# Patient Record
Sex: Female | Born: 1965 | Race: Black or African American | Hispanic: No | Marital: Single | Smoking: Never smoker
Health system: Southern US, Community
[De-identification: ages and names within clinical notes are randomized; demographics above are authoritative.]

## PROBLEM LIST (undated history)

## (undated) DIAGNOSIS — E119 Type 2 diabetes mellitus without complications: Secondary | ICD-10-CM

## (undated) HISTORY — PX: CHOLECYSTECTOMY: SHX55

## (undated) HISTORY — PX: LIPOMA EXCISION: SHX5283

---

## 2014-05-23 ENCOUNTER — Emergency Department (HOSPITAL_BASED_OUTPATIENT_CLINIC_OR_DEPARTMENT_OTHER)
Admission: EM | Admit: 2014-05-23 | Discharge: 2014-05-23 | Disposition: A | Discharge status: Home / Self Care | Payer: BLUE CROSS/BLUE SHIELD | Attending: Emergency Medicine | Admitting: Emergency Medicine

## 2014-05-23 ENCOUNTER — Encounter (HOSPITAL_BASED_OUTPATIENT_CLINIC_OR_DEPARTMENT_OTHER): Payer: Self-pay | Admitting: Emergency Medicine

## 2014-05-23 DIAGNOSIS — R42 Dizziness and giddiness: Secondary | ICD-10-CM

## 2014-05-23 DIAGNOSIS — R739 Hyperglycemia, unspecified: Secondary | ICD-10-CM

## 2014-05-23 DIAGNOSIS — Z79899 Other long term (current) drug therapy: Secondary | ICD-10-CM | POA: Diagnosis not present

## 2014-05-23 DIAGNOSIS — I951 Orthostatic hypotension: Secondary | ICD-10-CM | POA: Diagnosis not present

## 2014-05-23 DIAGNOSIS — E1165 Type 2 diabetes mellitus with hyperglycemia: Secondary | ICD-10-CM | POA: Diagnosis not present

## 2014-05-23 HISTORY — DX: Type 2 diabetes mellitus without complications: E11.9

## 2014-05-23 LAB — CBC WITH DIFFERENTIAL/PLATELET
BASOS ABS: 0 10*3/uL (ref 0.0–0.1)
Basophils Relative: 0 % (ref 0–1)
EOS PCT: 2 % (ref 0–5)
Eosinophils Absolute: 0.1 10*3/uL (ref 0.0–0.7)
HEMATOCRIT: 33.4 % — AB (ref 36.0–46.0)
HEMOGLOBIN: 11 g/dL — AB (ref 12.0–15.0)
LYMPHS ABS: 1.5 10*3/uL (ref 0.7–4.0)
LYMPHS PCT: 34 % (ref 12–46)
MCH: 24.6 pg — ABNORMAL LOW (ref 26.0–34.0)
MCHC: 32.9 g/dL (ref 30.0–36.0)
MCV: 74.6 fL — ABNORMAL LOW (ref 78.0–100.0)
Monocytes Absolute: 0.3 10*3/uL (ref 0.1–1.0)
Monocytes Relative: 6 % (ref 3–12)
NEUTROS ABS: 2.6 10*3/uL (ref 1.7–7.7)
Neutrophils Relative %: 58 % (ref 43–77)
Platelets: 229 10*3/uL (ref 150–400)
RBC: 4.48 MIL/uL (ref 3.87–5.11)
RDW: 13.2 % (ref 11.5–15.5)
WBC: 4.5 10*3/uL (ref 4.0–10.5)

## 2014-05-23 LAB — URINALYSIS, ROUTINE W REFLEX MICROSCOPIC
BILIRUBIN URINE: NEGATIVE
Ketones, ur: NEGATIVE mg/dL
Leukocytes, UA: NEGATIVE
Nitrite: NEGATIVE
Protein, ur: NEGATIVE mg/dL
Specific Gravity, Urine: 1.023 (ref 1.005–1.030)
UROBILINOGEN UA: 0.2 mg/dL (ref 0.0–1.0)
pH: 5.5 (ref 5.0–8.0)

## 2014-05-23 LAB — I-STAT CHEM 8, ED
BUN: 13 mg/dL (ref 6–23)
CALCIUM ION: 1.15 mmol/L (ref 1.12–1.23)
Chloride: 98 mEq/L (ref 96–112)
Creatinine, Ser: 0.7 mg/dL (ref 0.50–1.10)
GLUCOSE: 399 mg/dL — AB (ref 70–99)
POTASSIUM: 3.6 mmol/L (ref 3.5–5.1)
SODIUM: 136 mmol/L (ref 135–145)
TCO2: 20 mmol/L (ref 0–100)

## 2014-05-23 LAB — CBG MONITORING, ED
Glucose-Capillary: 451 mg/dL — ABNORMAL HIGH (ref 70–99)
Glucose-Capillary: 288 mg/dL — ABNORMAL HIGH (ref 70–99)

## 2014-05-23 LAB — URINE MICROSCOPIC-ADD ON

## 2014-05-23 MED ORDER — SODIUM CHLORIDE 0.9 % IV BOLUS (SEPSIS)
1000.0000 mL | Freq: Once | INTRAVENOUS | Status: AC
  Administered 2014-05-23: 1000 mL via INTRAVENOUS

## 2014-05-23 MED ORDER — METFORMIN HCL ER (MOD) 500 MG PO TB24: 500.0000 mg | ORAL_TABLET | Freq: Two times a day (BID) | ORAL | Status: AC

## 2014-05-23 NOTE — ED Provider Notes (Signed)
CSN: [id number]     Arrival date & time 05/23/14  1207 History   First MD Initiated Contact with Patient 05/23/14 1229     Chief Complaint  Patient presents with  . Dizziness     (Consider location/radiation/quality/duration/timing/severity/associated sxs/prior Treatment) HPI  Deanna Cline is a 49 y.o. female with PMH of type 2 diabetes presenting with 30 minute episode of lightheadedness this a.m. which resolved after sitting down. She denies any headache, slurred speech, visual changes, weakness. Patient has never had dizziness or lightheadedness before. Patient denies numbness, tingling. Patient denies any nausea, vomiting, abdominal pain. She does need her electronic normal. Patient concerned her blood sugar was low and a snack. She felt better. Patient states she is out of her metformin did not take it today. She did take last night. She does not monitor her blood sugar. No back pain or URI symptoms. Patient denies urinary symptoms.   Past Medical History  Diagnosis Date  . Diabetes mellitus without complication    Past Surgical History  Procedure Laterality Date  . Cesarean section    . Cholecystectomy    . Lipoma excision     No family history on file. History  Substance Use Topics  . Smoking status: Never Smoker   . Smokeless tobacco: Not on file  . Alcohol Use: Not on file   OB History    No data available     Review of Systems 10 Systems reviewed and are negative for acute change except as noted in the HPI.    Allergies  Zantac  Home Medications   Prior to Admission medications   Medication Sig Start Date End Date Taking? Authorizing Provider  metFORMIN (GLUMETZA) 500 MG (MOD) 24 hr tablet Take 500 mg by mouth 2 (two) times daily with a meal.   Yes Historical Provider, MD  metFORMIN (GLUMETZA) 500 MG (MOD) 24 hr tablet Take 1 tablet (500 mg total) by mouth 2 (two) times daily with a meal. 05/23/14   Benetta SparVictoria L Carry Weesner, PA-C   BP 148/90 mmHg  Pulse 90   Temp(Src) 98.4 F (36.9 C)  Resp 16  Ht 5\' 4"  (1.626 m)  Wt 148 lb (67.132 kg)  BMI 25.39 kg/m2  SpO2 98%  LMP 05/15/2014 Physical Exam  Constitutional: She appears well-developed and well-nourished. No distress.  HENT:  Head: Normocephalic and atraumatic.  Mouth/Throat: Oropharynx is clear and moist.  Eyes: Conjunctivae and EOM are normal. Pupils are equal, round, and reactive to light. Right eye exhibits no discharge. Left eye exhibits no discharge.  Neck: Normal range of motion. Neck supple.  No nuchal rigidity  Cardiovascular: Normal rate and regular rhythm.   Pulmonary/Chest: Effort normal and breath sounds normal. No respiratory distress. She has no wheezes.  Abdominal: Soft. Bowel sounds are normal. She exhibits no distension. There is no tenderness.  Neurological: She is alert. No cranial nerve deficit. Coordination normal.  Speech is clear and goal oriented. Right nystagmus. Strength 5/5 in upper and lower extremities. Sensation intact. Intact rapid alternating movements, finger to nose, and heel to shin. Negative Romberg. No pronator drift. Normal gait.   Skin: Skin is warm and dry. She is not diaphoretic.  Nursing note and vitals reviewed.   ED Course  Procedures (including critical care time) Labs Review Labs Reviewed  CBC WITH DIFFERENTIAL - Abnormal; Notable for the following:    Hemoglobin 11.0 (*)    HCT 33.4 (*)    MCV 74.6 (*)    MCH 24.6 (*)  All other components within normal limits  URINALYSIS, ROUTINE W REFLEX MICROSCOPIC - Abnormal; Notable for the following:    APPearance CLOUDY (*)    Glucose, UA >1000 (*)    Hgb urine dipstick MODERATE (*)    All other components within normal limits  URINE MICROSCOPIC-ADD ON - Abnormal; Notable for the following:    Squamous Epithelial / LPF FEW (*)    Bacteria, UA MANY (*)    All other components within normal limits  CBG MONITORING, ED - Abnormal; Notable for the following:    Glucose-Capillary 451 (*)     All other components within normal limits  I-STAT CHEM 8, ED - Abnormal; Notable for the following:    Glucose, Bld 399 (*)    All other components within normal limits  CBG MONITORING, ED - Abnormal; Notable for the following:    Glucose-Capillary 288 (*)    All other components within normal limits    Imaging Review No results found.   EKG Interpretation   Date/Time:  Tuesday May 23 2014 13:11:36 EST Ventricular Rate:  93 PR Interval:  152 QRS Duration: 70 QT Interval:  370 QTC Calculation: 460 R Axis:   -5 Text Interpretation:  Normal sinus rhythm Normal ECG No previous tracing  Confirmed by Anitra Lauth  MD, Alphonzo Lemmings ([id number]) on 05/23/2014 2:28:09 PM      MDM   Final diagnoses:  Lightheaded  Hyperglycemia  Orthostasis   Pt with history of diabetes treated with metformin presenting with episode of lightheadedness that resolved with sitting. Pt asymptomatic in ED. VSS. Patient orthostatic. No focal neurological findings. CBG 450. EKG without acute abnormalities. UA without ketones but evidence of asymptomatic bacteriuria. Patient without electrolyte abnormalities. A shunt with mild anemia. Lightheadedness likely due to orthostasis as well as hyperglycemia. Patient given fluids in ED with improvement of her hyperglycemia. Patient given strict prescription for metformin. Patient without a PCP. Patient to establish care and follow up. Discussed with patient the importance of following up with primary care ED resources provided. Pt appears reliable for follow up and is agreeable to discharge.   Discussed return precautions with patient. Discussed all results and patient verbalizes understanding and agrees with plan.  Case has been discussed with Dr. Anitra Lauth who agrees with the above plan and to discharge.     Louann Sjogren, PA-C 05/23/14 1737  Gwyneth Sprout, MD 05/26/14 2340

## 2014-05-23 NOTE — ED Notes (Signed)
Pt state she was lightheaded this am and believed her blood sugar was low.  CBG here done.  Pt states she is out of her metformin.

## 2014-05-23 NOTE — Discharge Instructions (Signed)
Return to the emergency room with worsening of symptoms, new symptoms or with symptoms that are concerning , especially severe worsening of headache, visual or speech changes, weakness in face, arms or legs. Read below information and follow all recommendations. Drink plenty of fluids and take your metformin.  Please call your doctor for a followup appointment within 24-48 hours. When you talk to your doctor please let them know that you were seen in the emergency department and have them acquire all of your records so that they can discuss the findings with you and formulate a treatment plan to fully care for your new and ongoing problems. If you do not have a primary care provider please call the number below under ED resources to establish care with a provider and follow up.    Dizziness Dizziness is a common problem. It is a feeling of unsteadiness or light-headedness. You may feel like you are about to faint. Dizziness can lead to injury if you stumble or fall. A person of any age group can suffer from dizziness, but dizziness is more common in older adults. CAUSES  Dizziness can be caused by many different things, including:  Middle ear problems.  Standing for too long.  Infections.  An allergic reaction.  Aging.  An emotional response to something, such as the sight of blood.  Side effects of medicines.  Tiredness.  Problems with circulation or blood pressure.  Excessive use of alcohol or medicines, or illegal drug use.  Breathing too fast (hyperventilation).  An irregular heart rhythm (arrhythmia).  A low red blood cell count (anemia).  Pregnancy.  Vomiting, diarrhea, fever, or other illnesses that cause body fluid loss (dehydration).  Diseases or conditions such as Parkinson's disease, high blood pressure (hypertension), diabetes, and thyroid problems.  Exposure to extreme heat. DIAGNOSIS  Your health care provider will ask about your symptoms, perform a  physical exam, and perform an electrocardiogram (ECG) to record the electrical activity of your heart. Your health care provider may also perform other heart or blood tests to determine the cause of your dizziness. These may include:  Transthoracic echocardiogram (TTE). During echocardiography, sound waves are used to evaluate how blood flows through your heart.  Transesophageal echocardiogram (TEE).  Cardiac monitoring. This allows your health care provider to monitor your heart rate and rhythm in real time.  Holter monitor. This is a portable device that records your heartbeat and can help diagnose heart arrhythmias. It allows your health care provider to track your heart activity for several days if needed.  Stress tests by exercise or by giving medicine that makes the heart beat faster. TREATMENT  Treatment of dizziness depends on the cause of your symptoms and can vary greatly. HOME CARE INSTRUCTIONS   Drink enough fluids to keep your urine clear or pale yellow. This is especially important in very hot weather. In older adults, it is also important in cold weather.  Take your medicine exactly as directed if your dizziness is caused by medicines. When taking blood pressure medicines, it is especially important to get up slowly.  Rise slowly from chairs and steady yourself until you feel okay.  In the morning, first sit up on the side of the bed. When you feel okay, stand slowly while holding onto something until you know your balance is fine.  Move your legs often if you need to stand in one place for a long time. Tighten and relax your muscles in your legs while standing.  Have someone stay  with you for 1-2 days if dizziness continues to be a problem. Do this until you feel you are well enough to stay alone. Have the person call your health care provider if he or she notices changes in you that are concerning.  Do not drive or use heavy machinery if you feel dizzy.  Do not drink  alcohol. SEEK IMMEDIATE MEDICAL CARE IF:   Your dizziness or light-headedness gets worse.  You feel nauseous or vomit.  You have problems talking, walking, or using your arms, hands, or legs.  You feel weak.  You are not thinking clearly or you have trouble forming sentences. It may take a friend or family member to notice this.  You have chest pain, abdominal pain, shortness of breath, or sweating.  Your vision changes.  You notice any bleeding.  You have side effects from medicine that seems to be getting worse rather than better. MAKE SURE YOU:   Understand these instructions.  Will watch your condition.  Will get help right away if you are not doing well or get worse. Document Released: 10/22/2000 Document Revised: 05/03/2013 Document Reviewed: 11/15/2010 [facility] Patient Information 2015 [place], [place]. This information is not intended to replace advice given to you by your health care provider. Make sure you discuss any questions you have with your health care provider. Hyperglycemia Hyperglycemia occurs when the glucose (sugar) in your blood is too high. Hyperglycemia can happen for many reasons, but it most often happens to people who do not know they have diabetes or are not managing their diabetes properly.  CAUSES  Whether you have diabetes or not, there are other causes of hyperglycemia. Hyperglycemia can occur when you have diabetes, but it can also occur in other situations that you might not be as aware of, such as: Diabetes  If you have diabetes and are having problems controlling your blood glucose, hyperglycemia could occur because of some of the following reasons:  Not following your meal plan.  Not taking your diabetes medications or not taking it properly.  Exercising less or doing less activity than you normally do.  Being sick. Pre-diabetes  This cannot be ignored. Before people develop Type 2 diabetes, they almost always have "pre-diabetes." This  is when your blood glucose levels are higher than normal, but not yet high enough to be diagnosed as diabetes. Research has shown that some long-term damage to the body, especially the heart and circulatory system, may already be occurring during pre-diabetes. If you take action to manage your blood glucose when you have pre-diabetes, you may delay or prevent Type 2 diabetes from developing. Stress  If you have diabetes, you may be "diet" controlled or on oral medications or insulin to control your diabetes. However, you may find that your blood glucose is higher than usual in the hospital whether you have diabetes or not. This is often referred to as "stress hyperglycemia." Stress can elevate your blood glucose. This happens because of hormones put out by the body during times of stress. If stress has been the cause of your high blood glucose, it can be followed regularly by your caregiver. That way he/she can make sure your hyperglycemia does not continue to get worse or progress to diabetes. Steroids  Steroids are medications that act on the infection fighting system (immune system) to block inflammation or infection. One side effect can be a rise in blood glucose. Most people can produce enough extra insulin to allow for this rise, but for those who  cannot, steroids make blood glucose levels go even higher. It is not unusual for steroid treatments to "uncover" diabetes that is developing. It is not always possible to determine if the hyperglycemia will go away after the steroids are stopped. A special blood test called an A1c is sometimes done to determine if your blood glucose was elevated before the steroids were started. SYMPTOMS  Thirsty.  Frequent urination.  Dry mouth.  Blurred vision.  Tired or fatigue.  Weakness.  Sleepy.  Tingling in feet or leg. DIAGNOSIS  Diagnosis is made by monitoring blood glucose in one or all of the following ways:  A1c test. This is a chemical found in  your blood.  Fingerstick blood glucose monitoring.  Laboratory results. TREATMENT  First, knowing the cause of the hyperglycemia is important before the hyperglycemia can be treated. Treatment may include, but is not be limited to:  Education.  Change or adjustment in medications.  Change or adjustment in meal plan.  Treatment for an illness, infection, etc.  More frequent blood glucose monitoring.  Change in exercise plan.  Decreasing or stopping steroids.  Lifestyle changes. HOME CARE INSTRUCTIONS   Test your blood glucose as directed.  Exercise regularly. Your caregiver will give you instructions about exercise. Pre-diabetes or diabetes which comes on with stress is helped by exercising.  Eat wholesome, balanced meals. Eat often and at regular, fixed times. Your caregiver or nutritionist will give you a meal plan to guide your sugar intake.  Being at an ideal weight is important. If needed, losing as little as 10 to 15 pounds may help improve blood glucose levels. SEEK MEDICAL CARE IF:   You have questions about medicine, activity, or diet.  You continue to have symptoms (problems such as increased thirst, urination, or weight gain). SEEK IMMEDIATE MEDICAL CARE IF:   You are vomiting or have diarrhea.  Your breath smells fruity.  You are breathing faster or slower.  You are very sleepy or incoherent.  You have numbness, tingling, or pain in your feet or hands.  You have chest pain.  Your symptoms get worse even though you have been following your caregiver's orders.  If you have any other questions or concerns. Document Released: 10/22/2000 Document Revised: 07/21/2011 Document Reviewed: 08/25/2011 [facility]ExitCare Patient Information 2015 [place], [place]. This information is not intended to replace advice given to you by your health care provider. Make sure you discuss any questions you have with your health care provider.   Emergency Department Resource  Guide 1) Find a Doctor and Pay Out of Pocket Although you won't have to find out who is covered by your insurance plan, it is a good idea to ask around and get recommendations. You will then need to call the office and see if the doctor you have chosen will accept you as a new patient and what types of options they offer for patients who are self-pay. Some doctors offer discounts or will set up payment plans for their patients who do not have insurance, but you will need to ask so you aren't surprised when you get to your appointment.  2) Contact Your Local Health Department Not all health departments have doctors that can see patients for sick visits, but many do, so it is worth a call to see if yours does. If you don't know where your local health department is, you can check in your phone book. The CDC also has a tool to help you locate your state's health department, and many  state websites also have listings of all of their local health departments.  3) Find a Walk-in Clinic If your illness is not likely to be very severe or complicated, you may want to try a walk in clinic. These are popping up all over the country in pharmacies, drugstores, and shopping centers. They're usually staffed by nurse practitioners or physician assistants that have been trained to treat common illnesses and complaints. They're usually fairly quick and inexpensive. However, if you have serious medical issues or chronic medical problems, these are probably not your best option.  No Primary Care Doctor: - Call Health Connect at  [phone] - they can help you locate a primary care doctor that  accepts your insurance, provides certain services, etc. - Physician Referral Service- [phone]  Chronic Pain Problems: Organization         Address  Phone   Notes  [facility]  [phone] Patients need to be referred by their primary care doctor.   Medication Assistance: Organization          Address  Phone   Notes  [facility] Medication [facility] [address] [place] [place], [place] [address] [phone] --Must be a resident of [facility] -- Must have NO insurance coverage whatsoever (no Medicaid/ Medicare, etc.) -- The pt. MUST have a primary care doctor that directs their care regularly and follows them in the community   MedAssist  [phone]   [employer]  [phone]    Agencies that provide inexpensive medical care: Organization         Address  Phone   Notes  [facility]  [phone]   [facility]    [phone]   [facility] [address] [place], [place] [address] [phone]   [facility] [address] [place] [place] [address] [place] [phone]   Planned Parenthood    [phone]   [facility]    [phone]   Community Health and [facility]  [address], [place] Phone:  [phone], Fax:  [phone] Hours of Operation:  9 am - 6 pm, M-F.  Also accepts Medicaid/Medicare and self-pay.  [facility]  [address], [place] Phone: [phone], Fax: [phone]. Hours of Operation:  8:30 am - 5:30 pm, M-F.  Also accepts Medicaid and self-pay.  [facility] [place] [address] [place] Phone: [phone]   [facility] [address] Natasha Bence [place], [place] [phone], Ext. [phone] Mondays & Thursdays: 7-9 AM.  First 15 patients are seen on a first come, first serve basis.    Medicaid-accepting [facility] Providers:  Organization         Address  Phone   Notes  [facility] [address], [place] [phone] Also accepts self-pay patients.  [facility] [address] [place], [place]  [phone]   [facility] [address], [place] [phone]   [facility] [address] [place] [phone]   Renaye Rakers [address], [place]   [phone] Only accepts [place] Access [place] patients after they have their name applied to their card.   Self-Pay (no insurance) in [facility]:  [employer]  Phone   Notes  Sickle Cell Patients, [facility] [address] [place], [place] [phone]   [facility] [address] [place], [place] [phone]   [facility] [place]  [address], [place] [phone]   [facility]/Dr. Osei-Bonsu  [address] [place] or [address], [place] [phone] Phone number for both [place] and [place] locations is the same.  Urgent Medical and [facility] [address] [place] [phone]   [facility] [address] [place] or [address] [phone]   [facility] [address] [place] [phone], phone; [phone], fax Sees patients 1st and 3rd Saturday of every month.  Must not qualify for public or private insurance (i.e. Medicaid, Medicare, [place] Health Choice, Veterans' Benefits)  Household income should be no more than 200% of the poverty level The clinic cannot treat you if you are pregnant or think you are pregnant  Sexually transmitted diseases are not treated at the clinic.    Dental Care: Organization         Address  Phone  Notes  [facility] Department of [facility] [address] [place], [place] [phone] Accepts children up to age 18 who are enrolled in [place] or [place] Health Choice; pregnant women with a Medicaid card; and children who have applied for Medicaid or [place] Health Choice, but were declined, whose parents can pay a reduced fee at time of service.  [facility]  [address], [place] [phone] Accepts children up to age 42 who are enrolled in [place] or [place] Health Choice; pregnant women with a Medicaid card; and children who have applied for Medicaid or [place] Health Choice, but were declined, whose parents can pay a reduced fee at time of service.  [facility]  [address] [place], [place] [phone] Patients are seen by appointment only. Walk-ins are not accepted. [facility] will see patients 28 years of age and older. Monday - Tuesday (8am-5pm) Most Wednesdays (8:30-5pm) $30 per visit, cash only  [facility] Adult [facility] Access [facility]  [address], [facility] [phone] Patients are seen by appointment only. Walk-ins are not accepted. [facility] will see patients 58 years of age and older. One Wednesday Evening (Monthly: Volunteer Based).  $30 per visit, cash only  [employer]  [phone] for adults; Children under age 46, call Graduate Pediatric Dentistry at [phone]. Children aged 17-14, please call [phone] to request a pediatric application.  Dental services are provided in all areas of dental care including fillings, crowns and bridges, complete and partial dentures, implants, gum treatment, root canals, and extractions. Preventive care is also provided. Treatment is provided to both adults and children. Patients are selected via a lottery and there is often a waiting list.   [facility] [address], [place]  [phone] [email or web address]   [facility] [address] [place], [place] [phone], Ext. [phone] Second and Fourth Thursday of each month, opens at 6:30 AM; Clinic ends at 9 AM.  Patients are seen on a first-come first-served basis, and a limited number are seen during each clinic.   [facility]  [address] [place], [place] [phone])  [phone]   Eligibility Requirements You must  have lived in [place], [place], or [place] counties for at least the last three months.   You cannot be eligible for state or federal sponsored [place], including [employer], [place], or [employer].   You generally cannot be eligible for healthcare insurance through your employer.    How to apply: Eligibility screenings are held every Tuesday and Wednesday afternoon from 1:00 pm until 4:00 pm. You do not need an appointment for the interview!  [facility] [address] [place] [address], [place], [place] [phone]   [facility] Health Department  [phone]   [facility] Department  [phone]   [facility]  [phone]    Behavioral Health Resources in the Community: Intensive Outpatient Programs Organization         Address  Phone  Notes  [facility] [address], [place], [place] [phone]   [facility] [address] [place], [place] [phone]   [facility] [address] [place], [place]  [phone]   [facility] [address],  [place], [place] [phone] or [phone]   Substance Abuse Resources Organization         Address  Phone  Notes  [facility]  [phone]   [facility]  [phone]   [facility]  [phone]   Floydene Flock  [phone]   Residential & Outpatient Substance Abuse Program  [phone]   Psychological Services Organization         Address  Phone  Notes  [facility]  [phone]   [facility]  [phone]   [facility] [address] [place] [phone] or [phone]    Mobile Crisis Teams Organization         Address  Phone  Notes  [facility], Mobile Crisis Care Unit  [phone]   [facility]  [address] [place], [place] [phone]   Doristine Locks [address] [place] [phone]    Self-Help/Support Groups Organization         Address  Phone             Notes  [facility] - variety of support groups  336- I7437963 Call for more information  Narcotics Anonymous (NA), [facility] [address], [employer] [place]  2 meetings at this location   Statistician         Address  Phone  Notes  [facility] [address],    [place]  [phone]   [facility]  [address] [place] [address], [place], [place] [phone]   [facility] [address] [place], [place] [phone] Admissions: 8am-3pm M-F  [facility] [address],    [place], [place] [phone]   [facility] [address] Starling Manns [place], [place] [phone]   [facility] [address]  [place], [place] [phone]   [facility] - Intensive Outpatient [address], [place], [place] [phone]   [facility] (Addiction [facility] [address] [place].,  [place], [place] [phone] or [phone]   [facility] ([facility]) [address], [place], [place] [phone] Accepts Medicaid  [facility] [address]  [place] [phone] Substance Abuse/Addiction Treatment   [facility] Resources Organization  Address  Phone  Notes  [facility]  [phone]   Angie Fava, PhD [address] Ervin Knack [place], [place]   [phone] or [phone]   [facility]   [address] [place], [place] [phone]   [facility] [address], [place], [place] [phone] Insurance/Medicaid/sponsorship through [employer] [place] and Families [address]                                    [place], [place] [phone] Therapy/tele-psych/case  [facility] [address], [place] [phone]    Dr. Lolly Mustache  [phone]   [facility]  [employer] [facility] Dept. 1) [address], [place] 2) [address], [place] 3)  [address], [place] [phone]  [phone]   [facility] Child Abuse Hotline 747-856-2889 or [phone] (After Hours)

## 2014-07-24 ENCOUNTER — Emergency Department (HOSPITAL_BASED_OUTPATIENT_CLINIC_OR_DEPARTMENT_OTHER): Payer: BLUE CROSS/BLUE SHIELD

## 2014-07-24 ENCOUNTER — Encounter (HOSPITAL_BASED_OUTPATIENT_CLINIC_OR_DEPARTMENT_OTHER): Payer: Self-pay | Admitting: Emergency Medicine

## 2014-07-24 DIAGNOSIS — E119 Type 2 diabetes mellitus without complications: Secondary | ICD-10-CM | POA: Insufficient documentation

## 2014-07-24 DIAGNOSIS — Z79899 Other long term (current) drug therapy: Secondary | ICD-10-CM | POA: Diagnosis not present

## 2014-07-24 DIAGNOSIS — N390 Urinary tract infection, site not specified: Secondary | ICD-10-CM | POA: Insufficient documentation

## 2014-07-24 DIAGNOSIS — R509 Fever, unspecified: Secondary | ICD-10-CM | POA: Diagnosis present

## 2014-07-24 NOTE — ED Notes (Signed)
Pt states she was at work and started having fever and chills

## 2014-07-25 ENCOUNTER — Encounter (HOSPITAL_BASED_OUTPATIENT_CLINIC_OR_DEPARTMENT_OTHER): Payer: Self-pay | Admitting: Emergency Medicine

## 2014-07-25 ENCOUNTER — Emergency Department (HOSPITAL_BASED_OUTPATIENT_CLINIC_OR_DEPARTMENT_OTHER)
Admission: EM | Admit: 2014-07-25 | Discharge: 2014-07-25 | Disposition: A | Discharge status: Home / Self Care | Payer: BLUE CROSS/BLUE SHIELD | Attending: Emergency Medicine | Admitting: Emergency Medicine

## 2014-07-25 DIAGNOSIS — N390 Urinary tract infection, site not specified: Secondary | ICD-10-CM

## 2014-07-25 DIAGNOSIS — J069 Acute upper respiratory infection, unspecified: Secondary | ICD-10-CM

## 2014-07-25 LAB — URINALYSIS, ROUTINE W REFLEX MICROSCOPIC
Bilirubin Urine: NEGATIVE
Ketones, ur: 15 mg/dL — AB
LEUKOCYTES UA: NEGATIVE
Nitrite: NEGATIVE
PH: 5 (ref 5.0–8.0)
Protein, ur: 100 mg/dL — AB
SPECIFIC GRAVITY, URINE: 1.019 (ref 1.005–1.030)
Urobilinogen, UA: 0.2 mg/dL (ref 0.0–1.0)

## 2014-07-25 LAB — URINE MICROSCOPIC-ADD ON

## 2014-07-25 LAB — CBG MONITORING, ED
GLUCOSE-CAPILLARY: 406 mg/dL — AB (ref 70–99)
Glucose-Capillary: 385 mg/dL — ABNORMAL HIGH (ref 70–99)

## 2014-07-25 MED ORDER — METFORMIN HCL 850 MG PO TABS: 850.0000 mg | ORAL_TABLET | Freq: Two times a day (BID) | ORAL | Status: AC

## 2014-07-25 MED ORDER — LIDOCAINE HCL (PF) 2 % IJ SOLN
INTRAMUSCULAR | Status: AC
  Administered 2014-07-25: 2.1 mL
  Filled 2014-07-25: qty 2

## 2014-07-25 MED ORDER — NITROFURANTOIN MONOHYD MACRO 100 MG PO CAPS: 100.0000 mg | ORAL_CAPSULE | Freq: Two times a day (BID) | ORAL | Status: AC

## 2014-07-25 MED ORDER — ACETAMINOPHEN 500 MG PO TABS
1000.0000 mg | ORAL_TABLET | Freq: Once | ORAL | Status: AC
  Administered 2014-07-25: 1000 mg via ORAL
  Filled 2014-07-25: qty 2

## 2014-07-25 MED ORDER — KETOROLAC TROMETHAMINE 60 MG/2ML IM SOLN
60.0000 mg | Freq: Once | INTRAMUSCULAR | Status: AC
  Administered 2014-07-25: 60 mg via INTRAMUSCULAR
  Filled 2014-07-25: qty 2

## 2014-07-25 MED ORDER — CEFTRIAXONE SODIUM 1 G IJ SOLR
1.0000 g | Freq: Once | INTRAMUSCULAR | Status: AC
Start: 2014-07-25 — End: 2014-07-25
  Administered 2014-07-25: 1 g via INTRAMUSCULAR
  Filled 2014-07-25: qty 10

## 2014-07-25 MED ORDER — METFORMIN HCL 500 MG PO TABS
1000.0000 mg | ORAL_TABLET | Freq: Once | ORAL | Status: AC
  Administered 2014-07-25: 1000 mg via ORAL
  Filled 2014-07-25: qty 2

## 2014-07-25 MED ORDER — INSULIN REGULAR HUMAN 100 UNIT/ML IJ SOLN
4.0000 [IU] | Freq: Once | INTRAMUSCULAR | Status: AC
  Administered 2014-07-25: 4 [IU] via SUBCUTANEOUS
  Filled 2014-07-25: qty 1

## 2014-07-25 NOTE — ED Provider Notes (Signed)
CSN: [id number]     Arrival date & time 07/24/14  2113 History   First MD Initiated Contact with Patient 07/25/14 0059     Chief Complaint  Patient presents with  . fever and chills      (Consider location/radiation/quality/duration/timing/severity/associated sxs/prior Treatment) Patient is a 49 y.o. female presenting with fever. The history is provided by the patient.  Fever Temp source:  Subjective Severity:  Moderate Onset quality:  Gradual Timing:  Constant Progression:  Unchanged Chronicity:  New Relieved by:  Nothing Worsened by:  Nothing tried Ineffective treatments:  None tried Associated symptoms: no confusion, no diarrhea, no rash, no rhinorrhea, no sore throat and no vomiting   Risk factors: no hx of cancer     Past Medical History  Diagnosis Date  . Diabetes mellitus without complication    Past Surgical History  Procedure Laterality Date  . Cesarean section    . Cholecystectomy    . Lipoma excision     History reviewed. No pertinent family history. History  Substance Use Topics  . Smoking status: Never Smoker   . Smokeless tobacco: Not on file  . Alcohol Use: Not on file   OB History    No data available     Review of Systems  Constitutional: Positive for fever.  HENT: Negative for rhinorrhea and sore throat.   Gastrointestinal: Negative for vomiting, diarrhea and constipation.  Musculoskeletal: Negative for neck pain and neck stiffness.  Skin: Negative for rash.  Psychiatric/Behavioral: Negative for confusion.  All other systems reviewed and are negative.     Allergies  Zantac  Home Medications   Prior to Admission medications   Medication Sig Start Date End Date Taking? Authorizing Provider  metFORMIN (GLUMETZA) 500 MG (MOD) 24 hr tablet Take 500 mg by mouth 2 (two) times daily with a meal.    Historical Provider, MD  metFORMIN (GLUMETZA) 500 MG (MOD) 24 hr tablet Take 1 tablet (500 mg total) by mouth 2 (two) times daily with a meal.  05/23/14   Oswaldo Conroy, PA-C   BP 141/84 mmHg  Pulse 106  Temp(Src) 99.5 F (37.5 C) (Oral)  Resp 20  Ht  (1.626 m)  Wt 145 lb (65.772 kg)  BMI 24.88 kg/m2  SpO2 100%  LMP 07/09/2014 Physical Exam  Constitutional: She is oriented to person, place, and time. She appears well-developed and well-nourished. No distress.  HENT:  Head: Normocephalic and atraumatic.  Mouth/Throat: Oropharynx is clear and moist.  Eyes: Conjunctivae are normal. Pupils are equal, round, and reactive to light.  Neck: Normal range of motion. Neck supple.  Cardiovascular: Normal rate, regular rhythm and intact distal pulses.   Pulmonary/Chest: Effort normal and breath sounds normal. She has no wheezes. She has no rales.  Abdominal: Soft. Bowel sounds are normal. There is no tenderness. There is no rebound and no guarding.  Musculoskeletal: Normal range of motion.  Neurological: She is alert and oriented to person, place, and time.  Skin: Skin is warm and dry.  Psychiatric: She has a normal mood and affect.    ED Course  Procedures (including critical care time) Labs Review Labs Reviewed  URINALYSIS, ROUTINE W REFLEX MICROSCOPIC - Abnormal; Notable for the following:    APPearance CLOUDY (*)    Glucose, UA >1000 (*)    Hgb urine dipstick TRACE (*)    Ketones, ur 15 (*)    Protein, ur 100 (*)    All other components within normal limits  URINE MICROSCOPIC-ADD ON -  Abnormal; Notable for the following:    Squamous Epithelial / LPF FEW (*)    Bacteria, UA MANY (*)    Casts HYALINE CASTS (*)    All other components within normal limits  CBG MONITORING, ED    Imaging Review Dg Chest 2 View  07/24/2014   CLINICAL DATA:  Dyspnea with fever  EXAM: CHEST  2 VIEW  COMPARISON:  None.  FINDINGS: The heart size and mediastinal contours are within normal limits. Both lungs are clear. The visualized skeletal structures are unremarkable.  IMPRESSION: No active cardiopulmonary disease.   Electronically  Signed   By: Ellery Plunkaniel R Mitchell M.D.   On: 07/24/2014 23:48     EKG Interpretation None      MDM   Final diagnoses:  URI (upper respiratory infection)   Will treat for UTI: patient needs to follow up for a recheck in 2 days with PMD.  Strict return precautions given   Aracelys Glade, MD 07/25/14 [id number]

## 2014-07-25 NOTE — ED Notes (Signed)
CBG:  [id number] 

## 2016-06-08 IMAGING — CR DG CHEST 2V
2 series · 2 of 2 positions shown · non-contrast
Comparison: None.

CLINICAL DATA: Dyspnea with fever

EXAM:
CHEST  2 VIEW

[w chest pa]
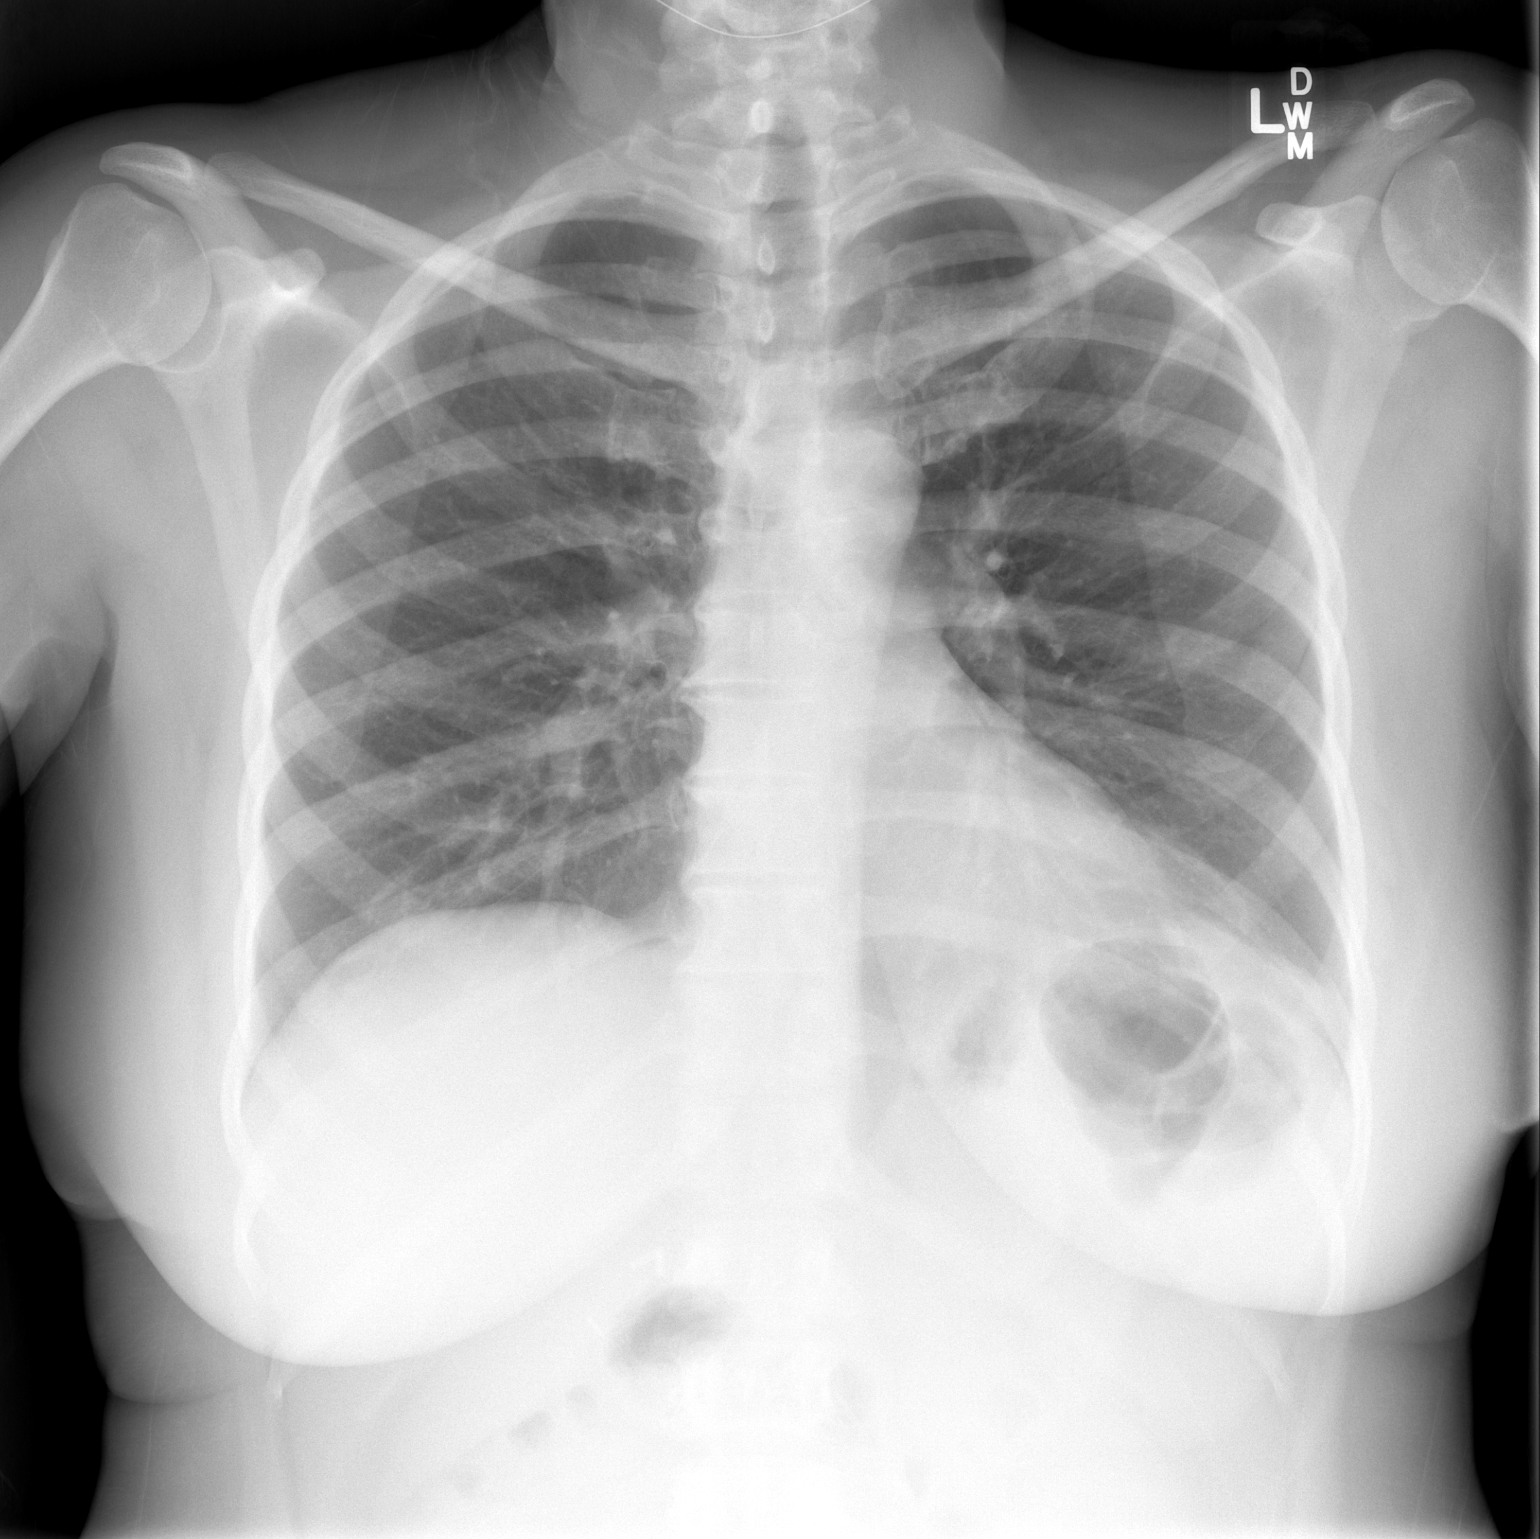

[w chest lat]
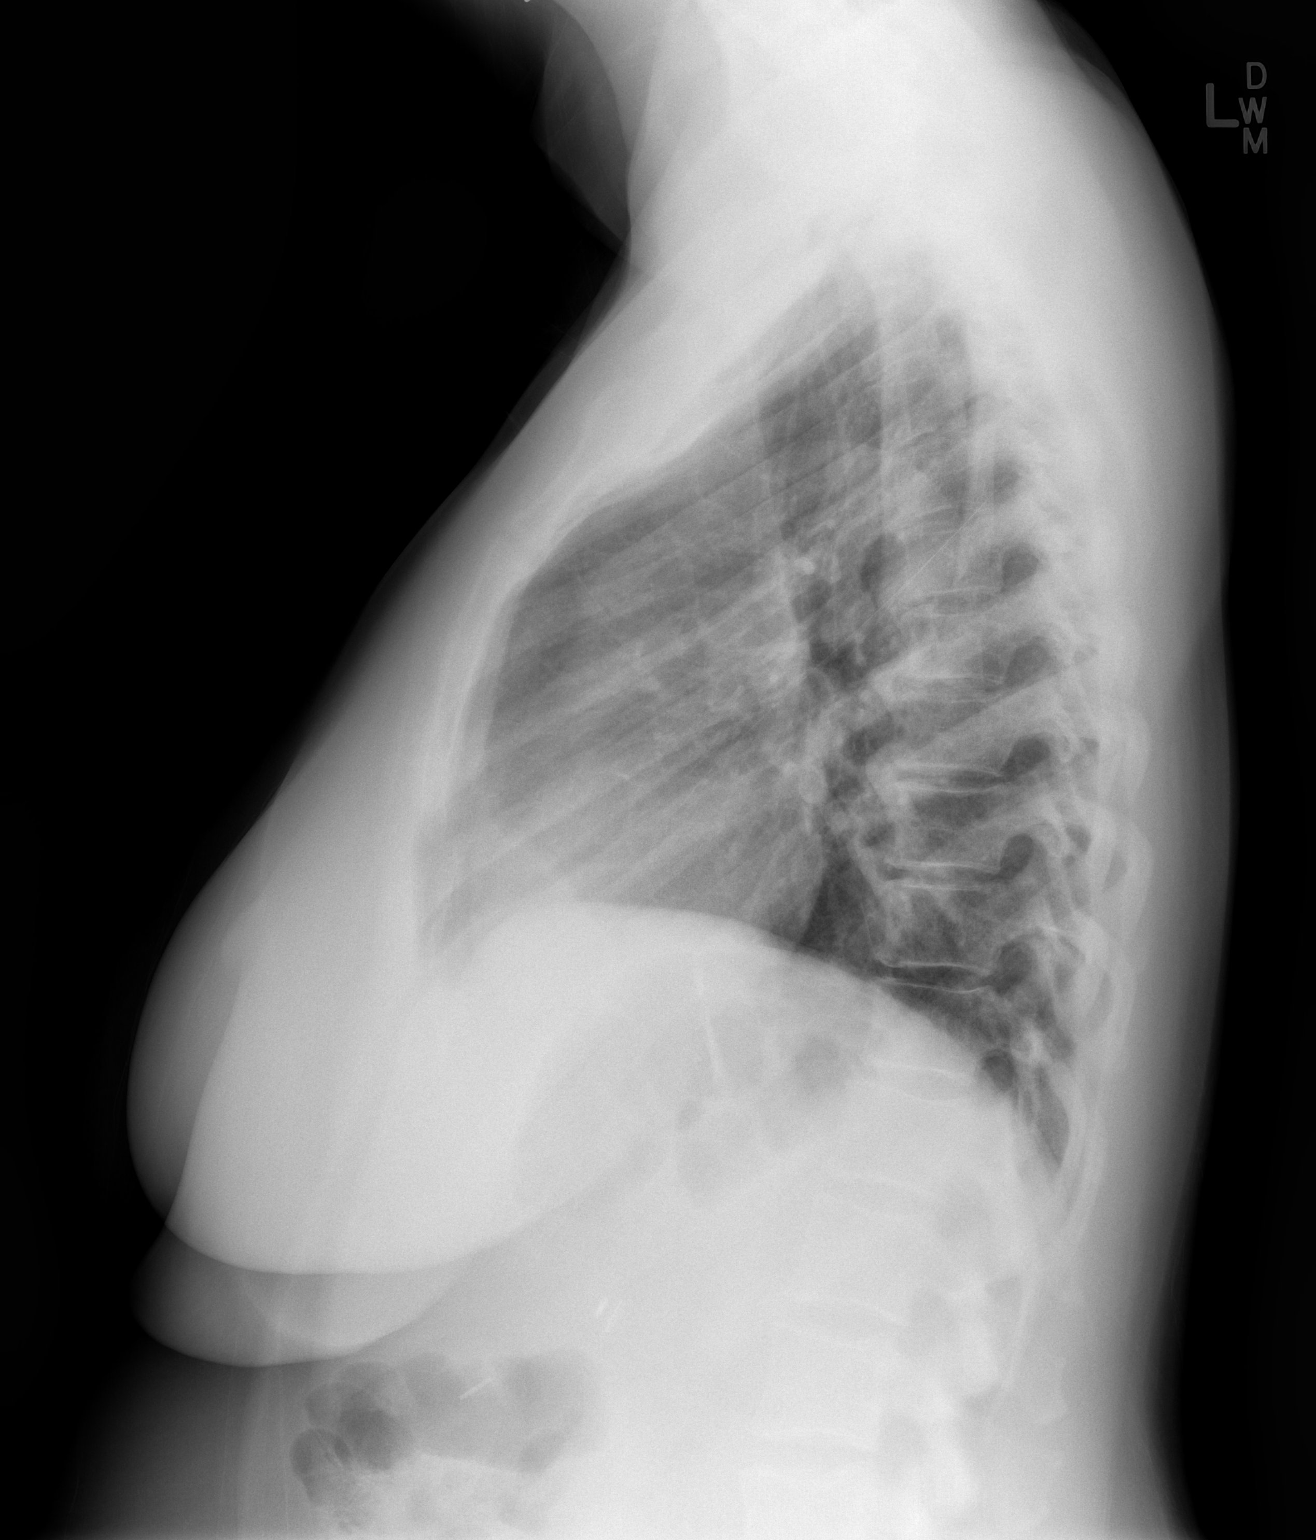

[2 of 2 positions shown; findings below may reference images not displayed]

FINDINGS: The heart size and mediastinal contours are within normal limits.
Both lungs are clear. The visualized skeletal structures are
unremarkable.
IMPRESSION: No active cardiopulmonary disease.
